# Patient Record
Sex: Male | Born: 1990
Health system: Southern US, Community
[De-identification: ages and names within clinical notes are randomized; demographics above are authoritative.]

## PROBLEM LIST (undated history)

## (undated) DIAGNOSIS — R011 Cardiac murmur, unspecified: Secondary | ICD-10-CM

## (undated) DIAGNOSIS — E785 Hyperlipidemia, unspecified: Secondary | ICD-10-CM

## (undated) DIAGNOSIS — Z8774 Personal history of (corrected) congenital malformations of heart and circulatory system: Secondary | ICD-10-CM

## (undated) HISTORY — DX: Hyperlipidemia, unspecified: E78.5

## (undated) HISTORY — DX: Personal history of (corrected) congenital malformations of heart and circulatory system: Z87.74

## (undated) HISTORY — DX: Cardiac murmur, unspecified: R01.1

---
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
## 2012-04-04 ENCOUNTER — Ambulatory Visit (INDEPENDENT_AMBULATORY_CARE_PROVIDER_SITE_OTHER): Payer: Managed Care, Other (non HMO)

## 2012-04-04 DIAGNOSIS — Z23 Encounter for immunization: Secondary | ICD-10-CM

## 2012-04-08 ENCOUNTER — Ambulatory Visit: Payer: Managed Care, Other (non HMO)

## 2016-02-05 ENCOUNTER — Ambulatory Visit (INDEPENDENT_AMBULATORY_CARE_PROVIDER_SITE_OTHER): Payer: Managed Care, Other (non HMO) | Admitting: Medical

## 2016-02-05 ENCOUNTER — Encounter: Payer: Self-pay | Admitting: Medical

## 2016-02-05 VITALS — BP 104/70 | HR 54 | Temp 98.0°F | Resp 16 | Ht 70.0 in | Wt 173.6 lb

## 2016-02-05 DIAGNOSIS — Z0000 Encounter for general adult medical examination without abnormal findings: Secondary | ICD-10-CM | POA: Diagnosis not present

## 2016-02-05 DIAGNOSIS — Q231 Congenital insufficiency of aortic valve: Secondary | ICD-10-CM

## 2016-02-05 DIAGNOSIS — Z114 Encounter for screening for human immunodeficiency virus [HIV]: Secondary | ICD-10-CM | POA: Diagnosis not present

## 2016-02-05 DIAGNOSIS — Z23 Encounter for immunization: Secondary | ICD-10-CM | POA: Diagnosis not present

## 2016-02-05 LAB — COMPREHENSIVE METABOLIC PANEL
ALBUMIN: 4.9 g/dL (ref 3.5–5.2)
ALT: 26 U/L (ref 0–53)
AST: 26 U/L (ref 0–37)
Alkaline Phosphatase: 80 U/L (ref 39–117)
BUN: 23 mg/dL (ref 6–23)
CALCIUM: 10 mg/dL (ref 8.4–10.5)
CHLORIDE: 102 meq/L (ref 96–112)
CO2: 29 mEq/L (ref 19–32)
Creatinine, Ser: 1.1 mg/dL (ref 0.40–1.50)
GFR: 88.49 mL/min (ref >60.00)
Glucose, Bld: 98 mg/dL (ref 70–99)
POTASSIUM: 4.5 meq/L (ref 3.5–5.1)
SODIUM: 138 meq/L (ref 135–145)
Total Bilirubin: 0.7 mg/dL (ref 0.2–1.2)
Total Protein: 7.6 g/dL (ref 6.0–8.3)

## 2016-02-05 LAB — LIPID PANEL
CHOLESTEROL: 245 mg/dL — AB (ref 0–200)
HDL: 55.5 mg/dL (ref >39.00)
LDL Cholesterol: 175 mg/dL — ABNORMAL HIGH (ref 0–99)
NonHDL: 189.25
TRIGLYCERIDES: 71 mg/dL (ref 0.0–149.0)
Total CHOL/HDL Ratio: 4
VLDL: 14.2 mg/dL (ref 0.0–40.0)

## 2016-02-05 LAB — CBC WITH DIFFERENTIAL/PLATELET
BASOS PCT: 0.3 % (ref 0.0–3.0)
Basophils Absolute: 0 10*3/uL (ref 0.0–0.1)
EOS PCT: 5.4 % — AB (ref 0.0–5.0)
Eosinophils Absolute: 0.3 10*3/uL (ref 0.0–0.7)
HCT: 44.7 % (ref 39.0–52.0)
HEMOGLOBIN: 14.9 g/dL (ref 13.0–17.0)
Lymphocytes Relative: 34.5 % (ref 12.0–46.0)
Lymphs Abs: 2.2 10*3/uL (ref 0.7–4.0)
MCHC: 33.3 g/dL (ref 30.0–36.0)
MCV: 84 fl (ref 78.0–100.0)
MONO ABS: 0.4 10*3/uL (ref 0.1–1.0)
Monocytes Relative: 6.2 % (ref 3.0–12.0)
Neutro Abs: 3.4 10*3/uL (ref 1.4–7.7)
Neutrophils Relative %: 53.6 % (ref 43.0–77.0)
Platelets: 314 10*3/uL (ref 150.0–400.0)
RBC: 5.32 Mil/uL (ref 4.22–5.81)
RDW: 13.4 % (ref 11.5–15.5)
WBC: 6.4 10*3/uL (ref 4.0–10.5)

## 2016-02-05 LAB — TSH: TSH: 2.68 u[IU]/mL (ref 0.35–4.50)

## 2016-02-05 NOTE — Progress Notes (Signed)
Subjective:    Patient ID: Kyle Malone, male    DOB: 1991/06/05, 25 y.o.   MRN: [id number]  HPI  Pt is filling out form so if he does get picked he will do some basic training initially and then go through [employer]. Pt described process of going through school will involve physical training.   I have reviewed pt PMH, PSH, FH, Social History and Surgical History.  Pt denies any major medical problems. No asthma, no seizures, no bleeding disorders,no diabetes or history of chest pain.  Pt states no history of organized sports. Pt in past worked out. Pt is states pretty active. No history of syncope. No exercise intolerance that he knows of.  Pt remembers last time seeing cardiologist for murmur around 15. He states at that time murmur no longer hear. Pt was never symptomatic with his bicuspid aortic valve history.      Review of Systems  Constitutional: Negative for fever, chills and fatigue.  HENT: Negative for congestion.   Respiratory: Negative for cough, chest tightness, shortness of breath and wheezing.   Cardiovascular: Negative for chest pain and palpitations.  Musculoskeletal: Negative for back pain.  Neurological: Negative for dizziness and headaches.  Hematological: Negative for adenopathy. Does not bruise/bleed easily.  Psychiatric/Behavioral: Negative for behavioral problems and confusion.    History reviewed. No pertinent past medical history.   Social History   Social History  . Marital Status: Single    Spouse Name: N/A  . Number of Children: N/A  . Years of Education: N/A   Occupational History  . Student    Social History Main Topics  . Smoking status: Never Smoker   . Smokeless tobacco: Never Used  . Alcohol Use: No  . Drug Use: No  . Sexual Activity: Not on file   Other Topics Concern  . Not on file   Social History Narrative   Drink caffeinated beverages-yes    History reviewed. No pertinent past surgical  history.  Family History  Problem Relation Age of Onset  . Hyperlipidemia Mother   . Hyperlipidemia Father   . Hypertension Maternal Grandmother   . Diabetes Maternal Grandmother   . Cancer Maternal Grandfather   . Hypertension Maternal Grandfather   . Diabetes Maternal Grandfather   . Hypertension Paternal Grandmother   . Diabetes Paternal Grandmother   . Cancer Paternal Grandfather   . Hypertension Paternal Grandfather   . Diabetes Paternal Grandfather     No Known Allergies  No current outpatient prescriptions on file prior to visit.   No current facility-administered medications on file prior to visit.    BP 104/70 mmHg  Pulse 54  Temp(Src) 98 F (36.7 C) (Oral)  Resp 16  Ht  (1.778 m)  Wt 173 lb 9.6 oz (78.744 kg)  BMI 24.91 kg/m2  SpO2 100%       Objective:   Physical Exam  General Mental Status- Alert. General Appearance- Not in acute distress.   Skin General: Color- Normal Color. Moisture- Normal Moisture.  Neck Carotid Arteries- Normal color. Moisture- Normal Moisture. No carotid bruits. No JVD.  Chest and Lung Exam Auscultation: Breath Sounds:-Normal.  Cardiovascular Auscultation:Rythm- Regular. Murmurs & Other Heart Sounds:Auscultation of the heart reveals- No Murmurs.(Did not hear any obvious murmur)  Abdomen Inspection:-Inspeection Normal. Palpation/Percussion:Note:No mass. Palpation and Percussion of the abdomen reveal- Non Tender, Non Distended + BS, no rebound or guarding.    Neurologic CranialTRAJON ROSETE- CN III-XII intact(No nystagmus), symmetric smile.  Drift Test:- No drift. Romberg Exam:- Negative.  Heal to Toe Gait exam:-Normal. Finger to Nose:- Normal/Intact Strength:- 5/5 equal and symmetric strength both upper and lower extremities.      Assessment & Plan:  Wellness examination For your physical we will up date your tetanus today. Will get cbc, cmp, tsh, hiv screen and lipid panel.    I did talk with Dr.  Drue NovelPaz you family MD today. Based on your history he wants to refer you to cardiologist to evaluate. You may need study such as ECHO. This would be safest as you approach training program.  I gave pt his forms. Asked to get cardiologist to fill out. Or if they won't then we should be able to after reviewing cardiologist notes.  I did put in referral as urgent and asked Victorino DikeJennifer to work on this as soon as possible.  Okie Bogacz, Ramon DredgeEdward, PA-C

## 2016-02-05 NOTE — Patient Instructions (Addendum)
Wellness examination For your physical we will up date your tetanus today. Will get cbc, cmp, tsh, hiv screen and lipid panel.   I did talk with Dr. Larose Kells you family MD today. Based on your history he wants to refer you to cardiologist to evaluate. You may need study such as ECHO. This would be safest as you approach training program.  Follow up here after cardiolgist evaluation.  Preventive Care for Adults, Male A healthy lifestyle and preventive care can promote health and wellness. Preventive health guidelines for men include the following key practices:  A routine yearly physical is a good way to check with your health care provider about your health and preventative screening. It is a chance to share any concerns and updates on your health and to receive a thorough exam.  Visit your dentist for a routine exam and preventative care every 6 months. Brush your teeth twice a day and floss once a day. Good oral hygiene prevents tooth decay and gum disease.  The frequency of eye exams is based on your age, health, family medical history, use of contact lenses, and other factors. Follow your health care provider's recommendations for frequency of eye exams.  Eat a healthy diet. Foods such as vegetables, fruits, whole grains, low-fat dairy products, and lean protein foods contain the nutrients you need without too many calories. Decrease your intake of foods high in solid fats, added sugars, and salt. Eat the right amount of calories for you.Get information about a proper diet from your health care provider, if necessary.  Regular physical exercise is one of the most important things you can do for your health. Most adults should get at least 150 minutes of moderate-intensity exercise (any activity that increases your heart rate and causes you to sweat) each week. In addition, most adults need muscle-strengthening exercises on 2 or more days a week.  Maintain a healthy weight. The body mass index  (BMI) is a screening tool to identify possible weight problems. It provides an estimate of body fat based on height and weight. Your health care provider can find your BMI and can help you achieve or maintain a healthy weight.For adults 20 years and older:  A BMI below 18.5 is considered underweight.  A BMI of 18.5 to 24.9 is normal.  A BMI of 25 to 29.9 is considered overweight.  A BMI of 30 and above is considered obese.  Maintain normal blood lipids and cholesterol levels by exercising and minimizing your intake of saturated fat. Eat a balanced diet with plenty of fruit and vegetables. Blood tests for lipids and cholesterol should begin at age 22 and be repeated every 5 years. If your lipid or cholesterol levels are high, you are over 50, or you are at high risk for heart disease, you may need your cholesterol levels checked more frequently.Ongoing high lipid and cholesterol levels should be treated with medicines if diet and exercise are not working.  If you smoke, find out from your health care provider how to quit. If you do not use tobacco, do not start.  Lung cancer screening is recommended for adults aged 10-80 years who are at high risk for developing lung cancer because of a history of smoking. A yearly low-dose CT scan of the lungs is recommended for people who have at least a 30-pack-year history of smoking and are a current smoker or have quit within the past 15 years. A pack year of smoking is smoking an average of 1 pack  of cigarettes a day for 1 year (for example: 1 pack a day for 30 years or 2 packs a day for 15 years). Yearly screening should continue until the smoker has stopped smoking for at least 15 years. Yearly screening should be stopped for people who develop a health problem that would prevent them from having lung cancer treatment.  If you choose to drink alcohol, do not have more than 2 drinks per day. One drink is considered to be 12 ounces (355 mL) of beer, 5 ounces  (148 mL) of wine, or 1.5 ounces (44 mL) of liquor.  Avoid use of street drugs. Do not share needles with anyone. Ask for help if you need support or instructions about stopping the use of drugs.  High blood pressure causes heart disease and increases the risk of stroke. Your blood pressure should be checked at least every 1-2 years. Ongoing high blood pressure should be treated with medicines, if weight loss and exercise are not effective.  If you are 45-79 years old, ask your health care provider if you should take aspirin to prevent heart disease.  Diabetes screening is done by taking a blood sample to check your blood glucose level after you have not eaten for a certain period of time (fasting). If you are not overweight and you do not have risk factors for diabetes, you should be screened once every 3 years starting at age 45. If you are overweight or obese and you are 40-70 years of age, you should be screened for diabetes every year as part of your cardiovascular risk assessment.  Colorectal cancer can be detected and often prevented. Most routine colorectal cancer screening begins at the age of 50 and continues through age 75. However, your health care provider may recommend screening at an earlier age if you have risk factors for colon cancer. On a yearly basis, your health care provider may provide home test kits to check for hidden blood in the stool. Use of a small camera at the end of a tube to directly examine the colon (sigmoidoscopy or colonoscopy) can detect the earliest forms of colorectal cancer. Talk to your health care provider about this at age 50, when routine screening begins. Direct exam of the colon should be repeated every 5-10 years through age 75, unless early forms of precancerous polyps or small growths are found.  People who are at an increased risk for hepatitis B should be screened for this virus. You are considered at high risk for hepatitis B if:  You were born in a  country where hepatitis B occurs often. Talk with your health care provider about which countries are considered high risk.  Your parents were born in a high-risk country and you have not received a shot to protect against hepatitis B (hepatitis B vaccine).  You have HIV or AIDS.  You use needles to inject street drugs.  You live with, or have sex with, someone who has hepatitis B.  You are a man who has sex with other men (MSM).  You get hemodialysis treatment.  You take certain medicines for conditions such as cancer, organ transplantation, and autoimmune conditions.  Hepatitis C blood testing is recommended for all people born from 1945 through 1965 and any individual with known risks for hepatitis C.  Practice safe sex. Use condoms and avoid high-risk sexual practices to reduce the spread of sexually transmitted infections (STIs). STIs include gonorrhea, chlamydia, syphilis, trichomonas, herpes, HPV, and human immunodeficiency virus (HIV). Herpes,   HIV, and HPV are viral illnesses that have no cure. They can result in disability, cancer, and death.  If you are a man who has sex with other men, you should be screened at least once per year for:  HIV.  Urethral, rectal, and pharyngeal infection of gonorrhea, chlamydia, or both.  If you are at risk of being infected with HIV, it is recommended that you take a prescription medicine daily to prevent HIV infection. This is called preexposure prophylaxis (PrEP). You are considered at risk if:  You are a man who has sex with other men (MSM) and have other risk factors.  You are a heterosexual man, are sexually active, and are at increased risk for HIV infection.  You take drugs by injection.  You are sexually active with a partner who has HIV.  Talk with your health care provider about whether you are at high risk of being infected with HIV. If you choose to begin PrEP, you should first be tested for HIV. You should then be tested  every 3 months for as long as you are taking PrEP.  A one-time screening for abdominal aortic aneurysm (AAA) and surgical repair of large AAAs by ultrasound are recommended for men ages 1 to 33 years who are current or former smokers.  Healthy men should no longer receive prostate-specific antigen (PSA) blood tests as part of routine cancer screening. Talk with your health care provider about prostate cancer screening.  Testicular cancer screening is not recommended for adult males who have no symptoms. Screening includes self-exam, a health care provider exam, and other screening tests. Consult with your health care provider about any symptoms you have or any concerns you have about testicular cancer.  Use sunscreen. Apply sunscreen liberally and repeatedly throughout the day. You should seek shade when your shadow is shorter than you. Protect yourself by wearing long sleeves, pants, a wide-brimmed hat, and sunglasses year round, whenever you are outdoors.  Once a month, do a whole-body skin exam, using a mirror to look at the skin on your back. Tell your health care provider about new moles, moles that have irregular borders, moles that are larger than a pencil eraser, or moles that have changed in shape or color.  Stay current with required vaccines (immunizations).  Influenza vaccine. All adults should be immunized every year.  Tetanus, diphtheria, and acellular pertussis (Td, Tdap) vaccine. An adult who has not previously received Tdap or who does not know his vaccine status should receive 1 dose of Tdap. This initial dose should be followed by tetanus and diphtheria toxoids (Td) booster doses every 10 years. Adults with an unknown or incomplete history of completing a 3-dose immunization series with Td-containing vaccines should begin or complete a primary immunization series including a Tdap dose. Adults should receive a Td booster every 10 years.  Varicella vaccine. An adult without  evidence of immunity to varicella should receive 2 doses or a second dose if he has previously received 1 dose.  Human papillomavirus (HPV) vaccine. Males aged 11-21 years who have not received the vaccine previously should receive the 3-dose series. Males aged 22-26 years may be immunized. Immunization is recommended through the age of 49 years for any male who has sex with males and did not get any or all doses earlier. Immunization is recommended for any person with an immunocompromised condition through the age of 54 years if he did not get any or all doses earlier. During the 3-dose series, the second  dose should be obtained 4-8 weeks after the first dose. The third dose should be obtained 24 weeks after the first dose and 16 weeks after the second dose.  Zoster vaccine. One dose is recommended for adults aged 20 years or older unless certain conditions are present.  Measles, mumps, and rubella (MMR) vaccine. Adults born before 40 generally are considered immune to measles and mumps. Adults born in [birth year] or later should have 1 or more doses of MMR vaccine unless there is a contraindication to the vaccine or there is laboratory evidence of immunity to each of the three diseases. A routine second dose of MMR vaccine should be obtained at least 28 days after the first dose for students attending postsecondary schools, health care workers, or international travelers. People who received inactivated measles vaccine or an unknown type of measles vaccine during 1963-1967 should receive 2 doses of MMR vaccine. People who received inactivated mumps vaccine or an unknown type of mumps vaccine before 1979 and are at high risk for mumps infection should consider immunization with 2 doses of MMR vaccine. Unvaccinated health care workers born before 14 who lack laboratory evidence of measles, mumps, or rubella immunity or laboratory confirmation of disease should consider measles and mumps immunization with 2 doses  of MMR vaccine or rubella immunization with 1 dose of MMR vaccine.  Pneumococcal 13-valent conjugate (PCV13) vaccine. When indicated, a person who is uncertain of his immunization history and has no record of immunization should receive the PCV13 vaccine. All adults 39 years of age and older should receive this vaccine. An adult aged 35 years or older who has certain medical conditions and has not been previously immunized should receive 1 dose of PCV13 vaccine. This PCV13 should be followed with a dose of pneumococcal polysaccharide (PPSV23) vaccine. Adults who are at high risk for pneumococcal disease should obtain the PPSV23 vaccine at least 8 weeks after the dose of PCV13 vaccine. Adults older than 23 years of age who have normal immune system function should obtain the PPSV23 vaccine dose at least 1 year after the dose of PCV13 vaccine.  Pneumococcal polysaccharide (PPSV23) vaccine. When PCV13 is also indicated, PCV13 should be obtained first. All adults aged 66 years and older should be immunized. An adult younger than age 26 years who has certain medical conditions should be immunized. Any person who resides in a nursing home or long-term care facility should be immunized. An adult smoker should be immunized. People with an immunocompromised condition and certain other conditions should receive both PCV13 and PPSV23 vaccines. People with human immunodeficiency virus (HIV) infection should be immunized as soon as possible after diagnosis. Immunization during chemotherapy or radiation therapy should be avoided. Routine use of PPSV23 vaccine is not recommended for American Indians, [place] Natives, or people younger than 65 years unless there are medical conditions that require PPSV23 vaccine. When indicated, people who have unknown immunization and have no record of immunization should receive PPSV23 vaccine. One-time revaccination 5 years after the first dose of PPSV23 is recommended for people aged 19-64  years who have chronic kidney failure, nephrotic syndrome, asplenia, or immunocompromised conditions. People who received 1-2 doses of PPSV23 before age 84 years should receive another dose of PPSV23 vaccine at age 63 years or later if at least 5 years have passed since the previous dose. Doses of PPSV23 are not needed for people immunized with PPSV23 at or after age 50 years.  Meningococcal vaccine. Adults with asplenia or persistent complement component deficiencies  should receive 2 doses of quadrivalent meningococcal conjugate (MenACWY-D) vaccine. The doses should be obtained at least 2 months apart. Microbiologists working with certain meningococcal bacteria, [employer] recruits, people at risk during an outbreak, and people who travel to or live in countries with a high rate of meningitis should be immunized. A first-year college student up through age 78 years who is living in a residence hall should receive a dose if he did not receive a dose on or after his 16th birthday. Adults who have certain high-risk conditions should receive one or more doses of vaccine.  Hepatitis A vaccine. Adults who wish to be protected from this disease, have chronic liver disease, work with hepatitis A-infected animals, work in hepatitis A research labs, or travel to or work in countries with a high rate of hepatitis A should be immunized. Adults who were previously unvaccinated and who anticipate close contact with an international adoptee during the first 60 days after arrival in the [place] from a country with a high rate of hepatitis A should be immunized.  Hepatitis B vaccine. Adults should be immunized if they wish to be protected from this disease, are under age 29 years and have diabetes, have chronic liver disease, have had more than one sex partner in the past 6 months, may be exposed to blood or other infectious body fluids, are household contacts or sex partners of hepatitis B positive people, are clients or  workers in certain care facilities, or travel to or work in countries with a high rate of hepatitis B.  Haemophilus influenzae type b (Hib) vaccine. A previously unvaccinated person with asplenia or sickle cell disease or having a scheduled splenectomy should receive 1 dose of Hib vaccine. Regardless of previous immunization, a recipient of a hematopoietic stem cell transplant should receive a 3-dose series 6-12 months after his successful transplant. Hib vaccine is not recommended for adults with HIV infection. Preventive Service / Frequency Ages 41 to 42  Blood pressure check.** / Every 3-5 years.  Lipid and cholesterol check.** / Every 5 years beginning at age 73.  Hepatitis C blood test.** / For any individual with known risks for hepatitis C.  Skin self-exam. / Monthly.  Influenza vaccine. / Every year.  Tetanus, diphtheria, and acellular pertussis (Tdap, Td) vaccine.** / Consult your health care provider. 1 dose of Td every 10 years.  Varicella vaccine.** / Consult your health care provider.  HPV vaccine. / 3 doses over 6 months, if 90 or younger.  Measles, mumps, rubella (MMR) vaccine.** / You need at least 1 dose of MMR if you were born in 1957 or later. You may also need a second dose.  Pneumococcal 13-valent conjugate (PCV13) vaccine.** / Consult your health care provider.  Pneumococcal polysaccharide (PPSV23) vaccine.** / 1 to 2 doses if you smoke cigarettes or if you have certain conditions.  Meningococcal vaccine.** / 1 dose if you are age 77 to 27 years and a Market researcher living in a residence hall, or have one of several medical conditions. You may also need additional booster doses.  Hepatitis A vaccine.** / Consult your health care provider.  Hepatitis B vaccine.** / Consult your health care provider.  Haemophilus influenzae type b (Hib) vaccine.** / Consult your health care provider. Ages 63 to 50  Blood pressure check.** / Every year.  Lipid and  cholesterol check.** / Every 5 years beginning at age 50.  Lung cancer screening. / Every year if you are aged 51-80 years and  have a 30-pack-year history of smoking and currently smoke or have quit within the past 15 years. Yearly screening is stopped once you have quit smoking for at least 15 years or develop a health problem that would prevent you from having lung cancer treatment.  Fecal occult blood test (FOBT) of stool. / Every year beginning at age 20 and continuing until age 27. You may not have to do this test if you get a colonoscopy every 10 years.  Flexible sigmoidoscopy** or colonoscopy.** / Every 5 years for a flexible sigmoidoscopy or every 10 years for a colonoscopy beginning at age 73 and continuing until age 82.  Hepatitis C blood test.** / For all people born from [birth year] through 1965 and any individual with known risks for hepatitis C.  Skin self-exam. / Monthly.  Influenza vaccine. / Every year.  Tetanus, diphtheria, and acellular pertussis (Tdap/Td) vaccine.** / Consult your health care provider. 1 dose of Td every 10 years.  Varicella vaccine.** / Consult your health care provider.  Zoster vaccine.** / 1 dose for adults aged 17 years or older.  Measles, mumps, rubella (MMR) vaccine.** / You need at least 1 dose of MMR if you were born in 1957 or later. You may also need a second dose.  Pneumococcal 13-valent conjugate (PCV13) vaccine.** / Consult your health care provider.  Pneumococcal polysaccharide (PPSV23) vaccine.** / 1 to 2 doses if you smoke cigarettes or if you have certain conditions.  Meningococcal vaccine.** / Consult your health care provider.  Hepatitis A vaccine.** / Consult your health care provider.  Hepatitis B vaccine.** / Consult your health care provider.  Haemophilus influenzae type b (Hib) vaccine.** / Consult your health care provider. Ages 27 and over  Blood pressure check.** / Every year.  Lipid and cholesterol check.**/ Every 5 years  beginning at age 14.  Lung cancer screening. / Every year if you are aged 57-80 years and have a 30-pack-year history of smoking and currently smoke or have quit within the past 15 years. Yearly screening is stopped once you have quit smoking for at least 15 years or develop a health problem that would prevent you from having lung cancer treatment.  Fecal occult blood test (FOBT) of stool. / Every year beginning at age 36 and continuing until age 33. You may not have to do this test if you get a colonoscopy every 10 years.  Flexible sigmoidoscopy** or colonoscopy.** / Every 5 years for a flexible sigmoidoscopy or every 10 years for a colonoscopy beginning at age 30 and continuing until age 38.  Hepatitis C blood test.** / For all people born from [birth year] through 1965 and any individual with known risks for hepatitis C.  Abdominal aortic aneurysm (AAA) screening.** / A one-time screening for ages 30 to 47 years who are current or former smokers.  Skin self-exam. / Monthly.  Influenza vaccine. / Every year.  Tetanus, diphtheria, and acellular pertussis (Tdap/Td) vaccine.** / 1 dose of Td every 10 years.  Varicella vaccine.** / Consult your health care provider.  Zoster vaccine.** / 1 dose for adults aged 35 years or older.  Pneumococcal 13-valent conjugate (PCV13) vaccine.** / 1 dose for all adults aged 1 years and older.  Pneumococcal polysaccharide (PPSV23) vaccine.** / 1 dose for all adults aged 63 years and older.  Meningococcal vaccine.** / Consult your health care provider.  Hepatitis A vaccine.** / Consult your health care provider.  Hepatitis B vaccine.** / Consult your health care provider.  Haemophilus influenzae type b (  Hib) vaccine.** / Consult your health care provider. **Family history and personal history of risk and conditions may change your health care provider's recommendations.   This information is not intended to replace advice given to you by your health care  provider. Make sure you discuss any questions you have with your health care provider.   Document Released: 10/06/2001 Document Revised: 08/31/2014 Document Reviewed: 01/05/2011 Elsevier Interactive Patient Education [employer].

## 2016-02-05 NOTE — Addendum Note (Signed)
Addended by: Neldon LabellaMABE, Markee Matera S on: 02/05/2016 10:57 AM   Modules accepted: Orders

## 2016-02-05 NOTE — Progress Notes (Signed)
Pre visit review using our clinic review tool, if applicable. No additional management support is needed unless otherwise documented below in the visit note. 

## 2016-02-05 NOTE — Assessment & Plan Note (Addendum)
For your physical we will up date your tetanus today. Will get cbc, cmp, tsh, hiv screen and lipid panel.

## 2016-02-06 ENCOUNTER — Telehealth: Payer: Self-pay | Admitting: Medical

## 2016-02-06 ENCOUNTER — Encounter: Payer: Self-pay | Admitting: Cardiovascular Disease

## 2016-02-06 LAB — HIV ANTIBODY (ROUTINE TESTING W REFLEX): HIV: NONREACTIVE

## 2016-02-06 NOTE — Addendum Note (Signed)
Addended by: Neldon LabellaMABE, HOLDEN S on: 02/06/2016 03:48 PM   Modules accepted: [employer]

## 2016-02-06 NOTE — Telephone Encounter (Signed)
Pt has work interview on on February 17, 2016. He needs cardiologist appointment sometime early next week if possible. He needs evaluation and echo before he gets physical clearance form filled. He needs forms fill out some time next week before the interview. I won't be in next week. So can you get him in with cardiology asap. Then maybe get him in with Dr. Drue NovelPaz after cardiologsit eval. Dr. Drue NovelPaz was his former pcp. Family is asking that Dr. Drue NovelPaz see him to fill out form. They also want to know what will cost be to see Dr. Drue NovelPaz. I won't be here next week.  Mom was calling and asking for information. He is 25 yo. Is she on his [employer] form.

## 2016-02-06 NOTE — Telephone Encounter (Signed)
Please advise 

## 2016-02-06 NOTE — Telephone Encounter (Signed)
Pt's mom returned schedulers call to address CMA's concern about scheduling.   Explained to mom that pt is no longer considered Dr. Drue NovelPaz pt after 3 years per CMA and that he would need to F/U with new established provider Esperanza RichtersEdward Saguier. Per AVS Fu is needed. Pt has to have fu after cardiologist appt and also needs it before Monday 6/26 due to work interview. (purpose of physical and visit) Mom says that form is needed for completion also for job. Pt is currently scheduled with previous PCP. Mom would like to know if he could f/u with Dr. Drue NovelPaz instead considering that he is pt's previous PCP and current will be out of the office when f/u is needed?    ALSO mom says that she would like to know what the F/U will consist of considering that CPE and Cardiologist visit has taking place?    Please advise. I will call mom back to FU with her per our conversation.    Thanks.

## 2016-02-07 NOTE — Telephone Encounter (Signed)
Patient has appointment with Cardiologist, Dr Eden EmmsNishan on Monday

## 2016-02-07 NOTE — Telephone Encounter (Signed)
Pt would need another new patient appt w/ Dr. Drue NovelPaz since he re-established w/ Ramon DredgeEdward and it has been longer than 3 years since previously seen by Dr. Drue NovelPaz.

## 2016-02-07 NOTE — Telephone Encounter (Signed)
I would advise pt to get cardiologsit to sign of on his forms. If not then call here on Monday for appointment with some body Tuesday or wed. Or see if someone willing to fill out form based on note. But cardiology note might not be available.

## 2016-02-10 ENCOUNTER — Encounter: Payer: Self-pay | Admitting: Cardiovascular Disease

## 2016-02-10 ENCOUNTER — Ambulatory Visit (HOSPITAL_COMMUNITY): Payer: Managed Care, Other (non HMO) | Attending: Internal Medicine

## 2016-02-10 ENCOUNTER — Encounter: Payer: Self-pay | Admitting: Nurse Practitioner

## 2016-02-10 ENCOUNTER — Ambulatory Visit (INDEPENDENT_AMBULATORY_CARE_PROVIDER_SITE_OTHER): Payer: Managed Care, Other (non HMO) | Admitting: Cardiovascular Disease

## 2016-02-10 ENCOUNTER — Other Ambulatory Visit: Payer: Self-pay

## 2016-02-10 VITALS — BP 126/70 | HR 61 | Ht 69.0 in | Wt 178.8 lb

## 2016-02-10 DIAGNOSIS — Z7189 Other specified counseling: Secondary | ICD-10-CM | POA: Diagnosis not present

## 2016-02-10 DIAGNOSIS — I351 Nonrheumatic aortic (valve) insufficiency: Secondary | ICD-10-CM | POA: Insufficient documentation

## 2016-02-10 DIAGNOSIS — Q239 Congenital malformation of aortic and mitral valves, unspecified: Secondary | ICD-10-CM | POA: Diagnosis not present

## 2016-02-10 DIAGNOSIS — R011 Cardiac murmur, unspecified: Secondary | ICD-10-CM

## 2016-02-10 DIAGNOSIS — Z7689 Persons encountering health services in other specified circumstances: Secondary | ICD-10-CM

## 2016-02-10 LAB — ECHOCARDIOGRAM COMPLETE
AVLVOTPG: 6 mmHg
CHL CUP MV DEC (S): 225
CHL CUP TV REG PEAK VELOCITY: 195 cm/s
E/e' ratio: 5.77
EWDT: 225 ms
FS: 40 % (ref 28–44)
HEIGHTINCHES: 69 in
IV/PV OW: 0.61
LA diam end sys: 33 mm
LA vol index: 17.5 mL/m2
LADIAMINDEX: 1.65 cm/m2
LASIZE: 33 mm
LAVOL: 35 mL
LAVOLA4C: 33 mL
LVEEAVG: 5.77
LVEEMED: 5.77
LVELAT: 17.1 cm/s
LVOT SV: 116 mL
LVOT VTI: 27.9 cm
LVOT area: 4.15 cm2
LVOT peak vel: 124 cm/s
LVOTD: 23 mm
MV Peak grad: 4 mmHg
MVPKAVEL: 42.9 m/s
MVPKEVEL: 98.7 m/s
P 1/2 time: 364 ms
PW: 9.94 mm — AB (ref 0.6–1.1)
RV LATERAL S' VELOCITY: 13 cm/s
RV sys press: 18 mmHg
TDI e' lateral: 17.1
TDI e' medial: 12.7
TRMAXVEL: 195 cm/s
TVPG: 195 mmHg
WEIGHTICAEL: 2860.8 [oz_av]

## 2016-02-10 NOTE — Progress Notes (Signed)
Cardiology Office Note   Date:  02/10/2016   ID:  Kyle Malone, DOB [birth year], MRN [id number]  PCP:  Esperanza RichtersSaguier, Edward, PA-C  Cardiologist:   Charlton HawsPeter Nashika Coker, MD   Chief Complaint  Patient presents with  . Establish Care    no sx      History of Present Illness: Kyle Malone is a 25 y.o. male who presents for evaluation of murmur and bicuspid aortic valve  Trying to get accepted to [place] [employer] program or [employer]. [facility] seen by [facility] doctor in [place] around age 29 for murmur Told he had PFO  The [facility]all [facility] and told he had bicuspid AV.  No evaluation since middle school.  Graduated from [employer]. Active with no symptoms. No chest pain, dyspnea, syncope or palpitations. No smoking drugs or stimulants. Told his AV has two leaflets but never caused a problem i.e. Stenosis or regurgitation. Has a sister who is healthy.  Family history otherwise remarkable For CAD and pacer in maternal grandparents  Able to do echo in office at time of office visit. No AS Normal EF AV is trileaflet with calcified right coronary cusp Mild AR Normal Aortic root and no signs of coarctation.   Past Medical History  Diagnosis Date  . Murmur   . H/O bicuspid aortic valve   . Hyperlipidemia     History reviewed. No pertinent past surgical history.   Current Outpatient Prescriptions  Medication Sig Dispense Refill  . loratadine (CLARITIN) 10 MG tablet Take 10 mg by mouth daily.     No current facility-administered medications for this visit.    Allergies:   Review of patient's allergies indicates no known allergies.    Social History:  The patient  reports that he has never smoked. He has never used smokeless tobacco. He reports that he drinks alcohol. He reports that he does not use illicit drugs.   Family History:  The patient's family history includes Cancer in his maternal grandfather and paternal grandfather; Diabetes in his maternal grandfather, maternal  grandmother, paternal grandfather, and paternal grandmother; Hyperlipidemia in his father and mother; Hypertension in his maternal grandfather, maternal grandmother, paternal grandfather, and paternal grandmother.    ROS:  Please see the history of present illness.   Otherwise, review of systems are positive for none.   All other systems are reviewed and negative.    PHYSICAL EXAM: VS:  BP 126/70 mmHg  Pulse 61  Ht 5\' 9"  (1.753 m)  Wt 178 lb 12.8 oz (81.103 kg)  BMI 26.39 kg/m2  SpO2 98% , BMI Body mass index is 26.39 kg/(m^2). Affect appropriate Healthy:  appears stated age HEENT: normal Neck supple with no adenopathy JVP normal no bruits no thyromegaly Lungs clear with no wheezing and good diaphragmatic motion Heart:  S1/S2 SEM  murmur, no rub, gallop or click PMI normal Abdomen: benighn, BS positve, no tenderness, no AAA no bruit.  No HSM or HJR Distal pulses intact with no bruits No edema Neuro non-focal Skin warm and dry No muscular weakness    EKG: NSR normal    Recent Labs: 02/05/2016: ALT 26; BUN 23; Creatinine, Ser 1.10; Hemoglobin 14.9; Platelets 314.0; Potassium 4.5; Sodium 138; TSH 2.68    Lipid Panel    Component Value Date/Time   CHOL 245* 02/05/2016 1041   TRIG 71.0 02/05/2016 1041   HDL 55.50 02/05/2016 1041   CHOLHDL 4 02/05/2016 1041   VLDL 14.2 02/05/2016 1041  LDLCALC 175* 02/05/2016 1041      Wt Readings from Last 3 Encounters:  02/10/16 178 lb 12.8 oz (81.103 kg)  02/05/16 173 lb 9.6 oz (78.744 kg)  10/12/11 156 lb (70.761 kg) (60 %*, Z = 0.26)   * Growth percentiles are based on CDC 2-20 Years data.      Other studies Reviewed: Additional studies/ records that were reviewed today include: Primary care notes labs and ECG .    ASSESSMENT AND PLAN:  1.Murmur: Asymptomatic , active with normal ECG  Echo with tri-leaflet AV mild AR.  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  BMet normal on 6/14 so ok to get gadolinium  Letter written for patient for job  Interview and results communicated to Dr Drue Novel and  Saguier PA   Current medicines are reviewed at length with the patient today.  The patient does not have concerns regarding medicines.  The following changes have been made:  no change  Labs/ tests ordered today include: Echo MRI MRA    Orders Placed This Encounter  Procedures  . MR Angiogram Chest W Wo Contrast  . MR Card Morphology Wo/W Cm  . Basic metabolic panel  . EKG 12-Lead  . ECHOCARDIOGRAM COMPLETE     Disposition:   FU with me in a year      Signed, Charlton Haws, MD  02/10/2016 4:33 PM    [facility] [address] [place], [place], [place]  [address] Phone: [phone]; Fax: [phone]

## 2016-02-10 NOTE — Patient Instructions (Addendum)
Medication Instructions:  Your physician recommends that you continue on your current medications as directed. Please refer to the Current Medication list given to you today.   Labwork: TODAY - basic metabolic panel   Testing/Procedures: Your physician has requested that you have a cardiac MRI. Cardiac MRI uses a computer to create images of your heart as its beating, producing both still and moving pictures of your heart and major blood vessels. For further information please visit [email or web address]. Please follow the instruction sheet given to you today for more information.  Your physician has requested that you have a cardiac MRI to look at your aorta.    Follow-Up: Your physician wants you to follow-up in: 1 year with Dr. Eden EmmsNishan.  You will receive a reminder letter in the mail two months in advance. If you don't receive a letter, please call our office to schedule the follow-up appointment.   If you need a refill on your cardiac medications before your next appointment, please call your pharmacy.   Thank you for choosing [facility]! Eligha BridegroomMichelle Fronnie Urton, RN [phone]

## 2016-02-11 ENCOUNTER — Telehealth: Payer: Self-pay | Admitting: Cardiovascular Disease

## 2016-02-11 NOTE — Telephone Encounter (Signed)
Spoke with pt's mom. Pt will be in tomorrow for appt.

## 2016-02-11 NOTE — Telephone Encounter (Signed)
Called patients home and left a voicemail regarding date and earlier time to be at the hospital for the MRA.

## 2016-02-12 ENCOUNTER — Encounter: Payer: Managed Care, Other (non HMO) | Admitting: Internal Medicine

## 2016-02-12 ENCOUNTER — Encounter: Payer: Self-pay | Admitting: Internal Medicine

## 2016-02-12 ENCOUNTER — Telehealth: Payer: Self-pay | Admitting: Cardiovascular Disease

## 2016-02-12 NOTE — Telephone Encounter (Signed)
Will send message to scheduler to change schedule to the following week.

## 2016-02-12 NOTE — Telephone Encounter (Signed)
°  New Prob   Requesting to reschedule MRI for possibly July 3rd due to job interview. She also has a question regarding the order. Please call.

## 2016-02-12 NOTE — Progress Notes (Deleted)
Pre visit review using our clinic review tool, if applicable. No additional management support is needed unless otherwise documented below in the visit note. 

## 2016-02-17 ENCOUNTER — Ambulatory Visit (INDEPENDENT_AMBULATORY_CARE_PROVIDER_SITE_OTHER): Payer: Managed Care, Other (non HMO) | Admitting: Medical

## 2016-02-17 ENCOUNTER — Encounter: Payer: Self-pay | Admitting: Medical

## 2016-02-17 VITALS — BP 116/59 | HR 67 | Temp 98.3°F | Ht 69.0 in | Wt 175.8 lb

## 2016-02-17 DIAGNOSIS — Q239 Congenital malformation of aortic and mitral valves, unspecified: Secondary | ICD-10-CM

## 2016-02-17 NOTE — Progress Notes (Signed)
Pre visit review using our clinic tool,if applicable. No additional management support is needed unless otherwise documented below in the visit note.  

## 2016-02-17 NOTE — Progress Notes (Signed)
   Subjective:    Patient ID: Casimer Leekthan B Simcoe, male    DOB: Jun 15, 1991, 25 y.o.   MRN: [id number]  HPI  Pt in for review of cardiology note review. Cardiologist did state he can apply and participate. Pt had echo done with cardiologist. I reviewed note carddiologist sent us statin he is low risk from cardiac stand point and can participate in training.   Review of Systems  Constitutional: Negative for fever, chills and fatigue.  Respiratory: Negative for cough, chest tightness, shortness of breath and wheezing.   Cardiovascular: Negative for chest pain and palpitations.  Gastrointestinal: Negative for abdominal pain.  Hematological: Negative for adenopathy. Does not bruise/bleed easily.    Past Medical History  Diagnosis Date  . Murmur   . H/O bicuspid aortic valve   . Hyperlipidemia      Social History   Social History  . Marital Status: Single    Spouse Name: N/A  . Number of Children: N/A  . Years of Education: N/A   Occupational History  . Student    Social History Main Topics  . Smoking status: Never Smoker   . Smokeless tobacco: Never Used  . Alcohol Use: Yes     Comment: social drinker  . Drug Use: No  . Sexual Activity: Not on file   Other Topics Concern  . Not on file   Social History Narrative   Drink caffeinated beverages-yes    No past surgical history on file.  Family History  Problem Relation Age of Onset  . Hyperlipidemia Mother   . Hyperlipidemia Father   . Hypertension Maternal Grandmother   . Diabetes Maternal Grandmother   . Cancer Maternal Grandfather   . Hypertension Maternal Grandfather   . Diabetes Maternal Grandfather   . Hypertension Paternal Grandmother   . Diabetes Paternal Grandmother   . Cancer Paternal Grandfather   . Hypertension Paternal Grandfather   . Diabetes Paternal Grandfather     No Known Allergies  Current Outpatient Prescriptions on File Prior to Visit  Medication Sig Dispense Refill  . loratadine  (CLARITIN) 10 MG tablet Take 10 mg by mouth daily.     No current facility-administered medications on file prior to visit.    BP 116/59 mmHg  Pulse 67  Temp(Src) 98.3 F (36.8 C) (Oral)  Ht 5\' 9"  (1.753 m)  Wt 175 lb 12.8 oz (79.742 kg)  BMI 25.95 kg/m2  SpO2 100%       Objective:   Physical Exam   General Mental Status- Alert. General Appearance- Not in acute distress.   Skin General: Color- Normal Color. Moisture- Normal Moisture.  Neck Carotid Arteries- Normal color. Moisture- Normal Moisture. No carotid bruits. No JVD.  Chest and Lung Exam Auscultation: Breath Sounds:-Normal.  Cardiovascular Auscultation:Rythm- Regular. Murmurs & Other Heart Sounds:Auscultation of the heart reveals- No Murmurs.   Neurologic Cranial Nerve exam:- CN III-XII intact(No nystagmus), symmetric smile. Strength:- 5/5 equal and symmetric strength both upper and lower extremities.       Assessment & Plan:  I filled out your forms today. I made copies since sheets list physician signature(not sure if absolute required or PA sig allowed). 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 If actual signature of physician required, I made copies of the form. In that event, I could get physician in our office to sign. I also gave you copy of note that cardiologist wrote.  Mckell Riecke, Ramon DredgeEdward, PA-C

## 2016-02-17 NOTE — Patient Instructions (Addendum)
I filled out your forms today. I made copies since sheets list physician signature(not sure if absolute required or PA sig allowed). 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 If actual signature of physician required, I made copies of the form. In that event, I could get physician in our office to sign. I also gave you copy of note that cardiologist wrote.

## 2016-02-18 ENCOUNTER — Ambulatory Visit (HOSPITAL_COMMUNITY): Payer: Managed Care, Other (non HMO)

## 2016-02-18 ENCOUNTER — Ambulatory Visit (HOSPITAL_COMMUNITY): Admission: RE | Admit: 2016-02-18 | Payer: Managed Care, Other (non HMO) | Source: Ambulatory Visit

## 2016-03-02 ENCOUNTER — Ambulatory Visit (HOSPITAL_COMMUNITY)
Admission: RE | Admit: 2016-03-02 | Discharge: 2016-03-02 | Disposition: A | Discharge status: Home / Self Care | Payer: Managed Care, Other (non HMO) | Source: Ambulatory Visit | Attending: Cardiovascular Disease | Admitting: Cardiovascular Disease

## 2016-03-02 ENCOUNTER — Ambulatory Visit (HOSPITAL_COMMUNITY): Payer: Managed Care, Other (non HMO)

## 2016-03-02 DIAGNOSIS — Q231 Congenital insufficiency of aortic valve: Secondary | ICD-10-CM | POA: Insufficient documentation

## 2016-03-02 DIAGNOSIS — Q239 Congenital malformation of aortic and mitral valves, unspecified: Secondary | ICD-10-CM | POA: Diagnosis present

## 2016-03-02 MED ORDER — GADOBENATE DIMEGLUMINE 529 MG/ML IV SOLN
26.0000 mL | Freq: Once | INTRAVENOUS | Status: AC | PRN
  Administered 2016-03-02: 26 mL via INTRAVENOUS

## 2016-03-09 DIAGNOSIS — Q239 Congenital malformation of aortic and mitral valves, unspecified: Secondary | ICD-10-CM | POA: Diagnosis not present

## 2016-06-15 ENCOUNTER — Telehealth: Payer: Self-pay | Admitting: Medical

## 2016-06-15 NOTE — Telephone Encounter (Signed)
Pt dropped off form to be filled out ASAP ([employer] Statement Form). Please call pt when ready tel [phone]).  [facility].

## 2016-06-16 NOTE — Telephone Encounter (Signed)
I spoke with the patient and advised him that his paperwork would be ready up front for pickup today. Pt voices understanding.

## 2016-06-16 NOTE — Telephone Encounter (Signed)
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 Reviewed cardiologist past note today before signing the form.

## 2016-06-24 NOTE — Progress Notes (Signed)
This encounter was created in error - please disregard.

## 2016-07-07 ENCOUNTER — Ambulatory Visit (INDEPENDENT_AMBULATORY_CARE_PROVIDER_SITE_OTHER): Payer: Managed Care, Other (non HMO)

## 2016-07-07 DIAGNOSIS — Z23 Encounter for immunization: Secondary | ICD-10-CM | POA: Diagnosis not present

## 2017-07-11 IMAGING — MR MR CARD MORPHOLOGY WO/W CM
16 of 17 series · 16 of 17 positions shown · IV contrast (multihance)
Comparison: none

CLINICAL DATA: Bicuspid Aortic Valve

EXAM:
CARDIAC MRI
TECHNIQUE: The patient was scanned on a 1.5 Tesla GE magnet. A dedicated
cardiac coil was used. Functional imaging was done using Fiesta
sequences. [DATE], and 4 chamber views were done to assess for RWMA's.
Modified Kiana rule using a short axis stack was used to
calculate an ejection fraction on a dedicated work station using
Circle software. The patient received 26 cc of Multihance. After 10
minutes inversion recovery sequences were used to assess for
infiltration and scar tissue.
CONTRAST:  26 cc Multihance

[Series 3: bSSFP · sagittal · 8.0mm · 1.33mm/px · 1 of 17 slices shown (1 of 8)]
[im 1/17]
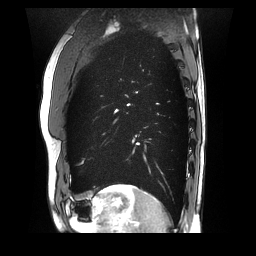

[Series 4: bSSFP · axial · 8.0mm · 1.25mm/px · 1 of 20 slices shown (2 of 8)]
[im 1/20]
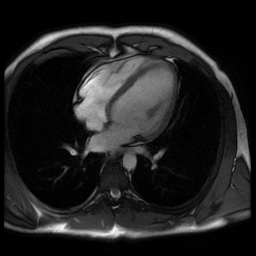

[Series 5: bSSFP · oblique · 8.0mm · 1.37mm/px · 1 of 340 slices shown (3 of 8)]
[im 1/340]
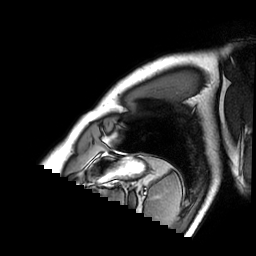

[Series 6: bSSFP · coronal · 8.0mm · 1.33mm/px · 1 of 20 slices shown (4 of 8)]
[im 1/20]
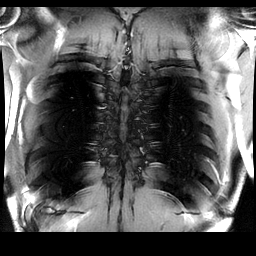

[Series 7: T1 · axial · 8.0mm · 0.74mm/px · 1 of 4 slices shown]
[im 1/4]
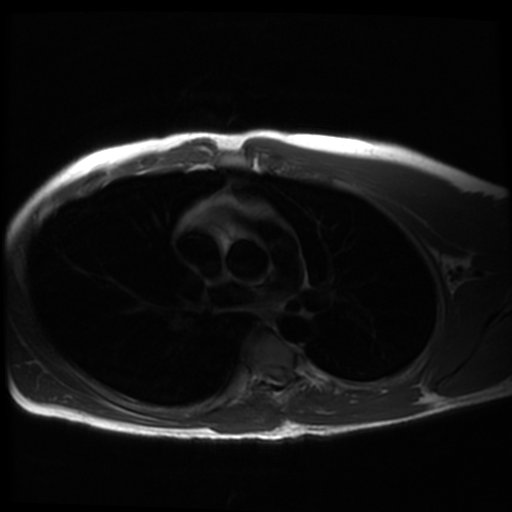

[Series 9: bSSFP · axial · 8.0mm · 1.33mm/px · 1 of 22 slices shown (5 of 8)]
[im 1/22]
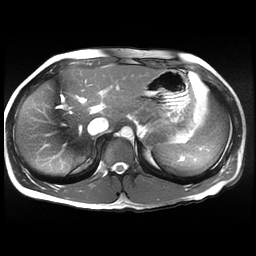

[Series 10: bSSFP · sagittal · 6.0mm · 1.37mm/px · 1 of 180 slices shown (6 of 8)]
[im 1/180]
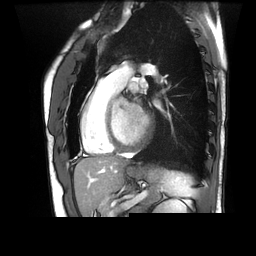

[Series 11: T2 · oblique · 8.0mm · 1.37mm/px · 1 of 60 slices shown (1 of 2)]
[im 1/60]
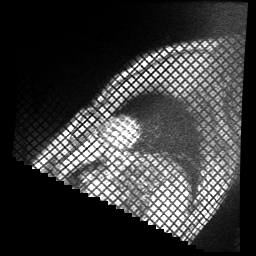

[Series 12: T2 · oblique · 8.0mm · 1.37mm/px · 1 of 60 slices shown (2 of 2)]
[im 1/60]
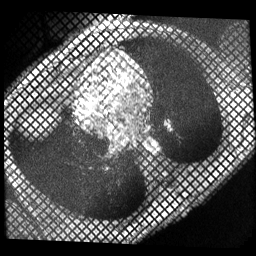

[Series 14: bSSFP · oblique · 8.0mm · 1.37mm/px · 1 of 60 slices shown (7 of 8)]
[im 1/60]
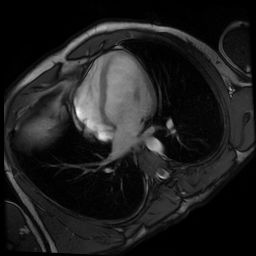

[Series 15: bSSFP · oblique · 6.0mm · 1.25mm/px · 1 of 140 slices shown (8 of 8)]
[im 1/140]
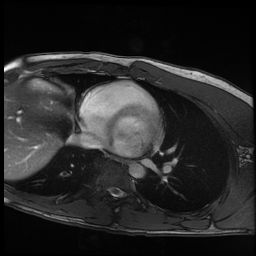

[Series 16: cine ir · oblique · 8.0mm · 1.37mm/px · 1 of 30 slices shown]
[im 1/30]
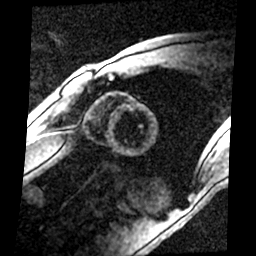

[Series 20: delayed ir prep · oblique · 8.0mm · 1.37mm/px · 1 of 7 slices shown (1 of 2)]
[im 1/7]
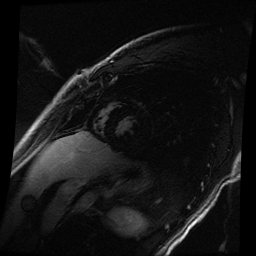

[Series 21: delayed ir prep · oblique · 8.0mm · 1.37mm/px · 1 of 9 slices shown (2 of 2)]
[im 1/9]
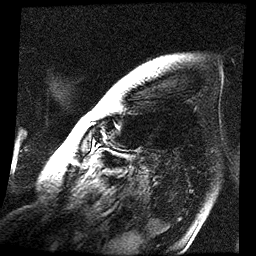

[Series 22: rad delayed ir · oblique · 8.0mm · 1.37mm/px · 1 of 3 slices shown]
[im 1/3]
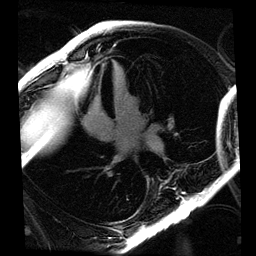

[((id)/(id)/1)-((id)/(id)/1) · sagittal · 3.0mm · 0.66mm/px · 1 of 68 slices shown]
[im 1/68]
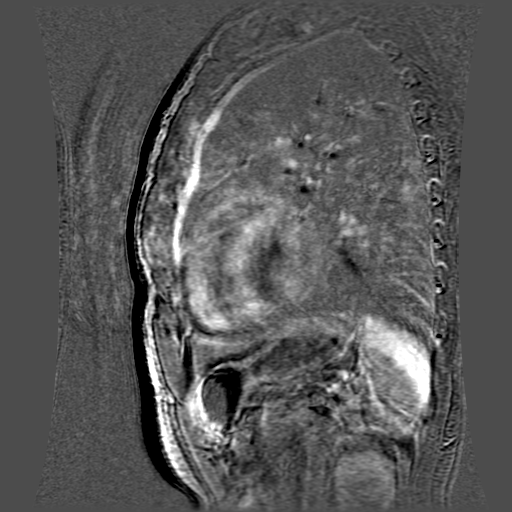

[16 of 17 positions shown; findings below may reference images not displayed]

FINDINGS: All 4 cardiac chambers were normal in size and function. There was
no ASD.VSD or pericardial effusion. The mitral tricupid and
pulmonary valves were normal.

The aortic valve was bicuspid with fused right and non coronary cusp
and raphe. There was no stenosis with valve area by planimetry over
4cm2. There was trivial

AR The quantitative EF was 66% (EDV 169 cc ESV 57 cc SV 112 cc)
Delayed enhancement images showed no infarct or scar

Aortic MR: Normal root, arch and descending thoracic aorta. No
coarctation Normal origin of the great vessels.

Root:  3.3 cm

Sinus:  3.3 cm

Arch:  2.0 cm

Descending Thoracic:  2.2 cm
IMPRESSION: 1) Bicuspid AV with trivial AR and no AS

2) Normal aortic root 3.3 cm with no coarctation or aneurysm

3) EF 66%

4) No delayed enhancement on post gadolinium images

Zimkita Akoo

## 2017-11-05 ENCOUNTER — Telehealth: Payer: Self-pay

## 2017-11-05 NOTE — Telephone Encounter (Signed)
Patient received tdap vaccine in 01/2016. No tdap needed now.
